# Patient Record
Sex: Male | Born: 1995 | Race: Black or African American | Hispanic: No | Marital: Single | Smoking: Smoker, current status unknown
Health system: Southern US, Community
[De-identification: ages and names within clinical notes are randomized; demographics above are authoritative.]

## PROBLEM LIST (undated history)

## (undated) DIAGNOSIS — J45909 Unspecified asthma, uncomplicated: Secondary | ICD-10-CM

## (undated) HISTORY — DX: Unspecified asthma, uncomplicated: J45.909

## (undated) HISTORY — PX: ARTHROSCOPY KNEE, MENISCUS REPAIR: SHX51041

## (undated) NOTE — Progress Notes (Signed)
 Formatting of this note is different from the original.    Travis Young is a 48 yr old male   MRN: [id number]    = = =  Pre-SS VISIT: OV 08/15/2020 [facility] CARPENTER MD  HTN no  = = =  Pt  picked up & instructed to do:      Home sleep study system.     2020-08-15, WP # [id number], (AM)    Home sleep study equipment loaner order Code ([id number]) entered.   Directions followed w/o difficulty.   Loaner agreement signed by patient for return of this unit on the next business day.  = =   Referral note:   Provider Comments  08/15/2020 1:39 PM EST  Entered by Ahmed M.D., Vanice F   Pertinent history:   *fatigue and daytime sleepiness For Medicare (or potential Medicare) patients, the following questions must be answered at a face-to-face visit. Date of face-to-face visit: 08/15/2020 ( date or NA).    Risk for Obstructive Sleep Apnea:  Does the patient report:  Snoring:no  Daytime fatigue, excessive daytime sleepiness, feel tired:yes  Stopping breathing while sleeping:no  Hypertension:no     = = =                    [facility] sleep medicine sleep apnea questionnaire    ESS 12/24    1) Have you had sleep study in past?  no If yes; where:  If yes when:    2) Do you have upcoming surgery? no  3) Have you been diagnosed with any of:  Sleep apnea no  High blood pressure (HTN) no  Congestive heart failure (CHF) no  Atrial Fibrillation (AF) no  Stroke? no   4) Are you planning to have bariatric surgery? no   5) Do you have a heart pacemaker?  no  6) Do you take opiates for pain? no  7) Have you been prescribed oxygen for home use?  no  8) Do you snore?  no  9) Has anyone told you that you stop breathing at night? no  10) Do you wake up gasping for air or choking?  no  11) Do you feel tired on waking up in the morning? yes  12) What time do you go to bed?  2330 hours  13) What time do you wake up? 0530 hours  14) How much time does it take for you to fall asleep? 30 min    Completed by:  LOUIE KINGS, August 15, 2020, 2:24 PM      Electronically  signed by Kings Louie at 08/15/2020  2:30 PM EST

## (undated) NOTE — Addendum Note (Signed)
 Addended by: ROANNE HAKIM, CAMILLE on: 08/17/2020 09:55 AM     Modules accepted: Orders      Electronically signed by Roanne Hakim Gammons at 08/17/2020  9:55 AM EST

## (undated) NOTE — Progress Notes (Signed)
 Formatting of this note might be different from the original.  I have offered the flu vaccine to the member, the member has declined. The main reason for declination today is:    Pt declined  Electronically signed by Stacia Outlaw at 08/15/2020  2:30 PM EST

## (undated) NOTE — Progress Notes (Signed)
 Formatting of this note might be different from the original.  Impression:  Mild obstructive sleep apnea*    Recommendations:     Clinical correlation is recommended to determine optimal treatment options including CPAP, oral appliance, surgery, positional therapy and or weight loss.    If CPAP is chosen, initiate home AutoPAP titration as per protocol. The patient should have telephone appointment with sleep apnea clinic in two few weeks after using APAP device to assess compliance and efficacy of the machine through [facility].    Caution should be exercised in operating heavy machinery or driving and the patient should not drive if feeling sleepy.    Counsel on importance of weight loss and encourage to maintain ideal body weight.    Avoid use of alcohol and CNS/respiratory depressant medications     Counsel on importance of obtaining adequate amount of sleep and encourage the patient to obtain 7 to 8 hours of sleep every night.    * Patients with mild obstructive sleep apnea needs treatment if they have other symptoms or comorbidities like excessive daytime sleepiness, insomnia, hypertension, ischemic heart disease, mood disorder, impaired cognition, Afib, and Hx of stroke    Completed by:  PEYTON KIN MD, August 21, 2020, 3:33 PM     Electronically signed by Mirabella, Allyson (M.D.), M.D. at 08/21/2020  3:33 PM EST

## (undated) NOTE — Telephone Encounter (Signed)
 Formatting of this note is different from the original.    [place]  Medicaid Case Management Outreach Note      11/18/2023    Reason for Outreach: Ongoing Case Management Follow-Up     Outreach Outcome:  Coverage terminated. No further follow up needed.  Patient was notified of his coverage termination and he confirmed that he is aware and plans to follow up with [place] Medicaid. CM provided him [place] Medicaid phone number at [phone].   Follow-Up Communication Plan: No further follow up needed.     Donato (R.N.) Suann, R.N.  [place]  Medicaid Case Manager  11/18/2023, 1:35 PM   Donato Suann, RN, MSN, CCM  [facility] [place]  Case Manager  Tel: [phone]    Electronically signed by Suann Donato (R.N.), R.N. at 11/18/2023  1:50 PM EDT

## (undated) NOTE — Progress Notes (Signed)
 Formatting of this note might be different from the original.  Images from the original note were not included.        Electronically signed by Roanne Sisto Gammons at 08/17/2020  9:55 AM EST

---
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
## 2016-02-08 ENCOUNTER — Ambulatory Visit: Payer: Medicaid Other

## 2016-02-08 NOTE — Pre-Procedure Instructions (Signed)
PSS interview with patient's mother, Alona Bene, at request of pt and his mother.

## 2016-02-12 ENCOUNTER — Ambulatory Visit
Admission: RE | Admit: 2016-02-12 | Discharge: 2016-02-12 | Disposition: A | Discharge status: Home / Self Care | Payer: Medicaid Other | Source: Ambulatory Visit | Attending: Orthopaedic Surgery | Admitting: Orthopaedic Surgery

## 2016-02-12 ENCOUNTER — Ambulatory Visit: Payer: Medicaid Other

## 2016-02-12 ENCOUNTER — Ambulatory Visit: Payer: Medicaid Other | Admitting: Anesthesiology

## 2016-02-12 ENCOUNTER — Encounter
Admission: RE | Disposition: A | Discharge status: Home / Self Care | Payer: Self-pay | Source: Ambulatory Visit | Attending: Orthopaedic Surgery

## 2016-02-12 ENCOUNTER — Ambulatory Visit: Payer: Medicaid Other | Admitting: Orthopaedic Surgery

## 2016-02-12 DIAGNOSIS — W2209XA Striking against other stationary object, initial encounter: Secondary | ICD-10-CM | POA: Insufficient documentation

## 2016-02-12 DIAGNOSIS — S62324D Displaced fracture of shaft of fourth metacarpal bone, right hand, subsequent encounter for fracture with routine healing: Secondary | ICD-10-CM

## 2016-02-12 DIAGNOSIS — S62326A Displaced fracture of shaft of fifth metacarpal bone, right hand, initial encounter for closed fracture: Secondary | ICD-10-CM | POA: Insufficient documentation

## 2016-02-12 DIAGNOSIS — S62329A Displaced fracture of shaft of unspecified metacarpal bone, initial encounter for closed fracture: Secondary | ICD-10-CM | POA: Diagnosis present

## 2016-02-12 DIAGNOSIS — S62324A Displaced fracture of shaft of fourth metacarpal bone, right hand, initial encounter for closed fracture: Secondary | ICD-10-CM | POA: Insufficient documentation

## 2016-02-12 HISTORY — PX: ORIF, HAND: SHX4890

## 2016-02-12 SURGERY — ORIF, HAND
Anesthesia: Anesthesia General | Site: Hand | Laterality: Right | Wound class: Clean

## 2016-02-12 MED ORDER — LACTATED RINGERS IV SOLN
INTRAVENOUS | Status: DC | PRN
Start: 2016-02-12 — End: 2016-02-12

## 2016-02-12 MED ORDER — ONDANSETRON HCL 4 MG/2ML IJ SOLN
4.0000 mg | Freq: Once | INTRAMUSCULAR | Status: DC | PRN
Start: 2016-02-12 — End: 2016-02-12

## 2016-02-12 MED ORDER — ONDANSETRON HCL 4 MG/2ML IJ SOLN
INTRAMUSCULAR | Status: DC | PRN
Start: 2016-02-12 — End: 2016-02-12
  Administered 2016-02-12: 4 mg via INTRAVENOUS

## 2016-02-12 MED ORDER — OXYCODONE HCL 5 MG PO TABS
5.0000 mg | ORAL_TABLET | Freq: Once | ORAL | Status: DC | PRN
Start: 2016-02-12 — End: 2016-02-12

## 2016-02-12 MED ORDER — ONDANSETRON HCL 4 MG/2ML IJ SOLN
INTRAMUSCULAR | Status: DC
Start: 2016-02-12 — End: 2016-02-12
  Filled 2016-02-12: qty 2

## 2016-02-12 MED ORDER — FENTANYL CITRATE (PF) 50 MCG/ML IJ SOLN (WRAP)
25.0000 ug | INTRAMUSCULAR | Status: DC | PRN
Start: 2016-02-12 — End: 2016-02-12

## 2016-02-12 MED ORDER — BUPIVACAINE HCL (PF) 0.5 % IJ SOLN
INTRAMUSCULAR | Status: AC
Start: 2016-02-12 — End: ?
  Filled 2016-02-12: qty 30

## 2016-02-12 MED ORDER — OXYCODONE-ACETAMINOPHEN 5-325 MG PO TABS
2.0000 | ORAL_TABLET | ORAL | Status: DC | PRN
Start: 2016-02-12 — End: 2017-10-02

## 2016-02-12 MED ORDER — FENTANYL CITRATE (PF) 50 MCG/ML IJ SOLN (WRAP)
INTRAMUSCULAR | Status: DC
Start: 2016-02-12 — End: 2016-02-12
  Filled 2016-02-12: qty 2

## 2016-02-12 MED ORDER — LIDOCAINE HCL (PF) 2 % IJ SOLN
INTRAMUSCULAR | Status: AC
Start: 2016-02-12 — End: ?
  Filled 2016-02-12: qty 5

## 2016-02-12 MED ORDER — CEFAZOLIN SODIUM-DEXTROSE 2-3 GM-% IV SOLR
INTRAVENOUS | Status: AC
Start: 2016-02-12 — End: ?
  Filled 2016-02-12: qty 50

## 2016-02-12 MED ORDER — PROMETHAZINE HCL 25 MG/ML IJ SOLN
6.2500 mg | Freq: Once | INTRAMUSCULAR | Status: DC | PRN
Start: 2016-02-12 — End: 2016-02-12

## 2016-02-12 MED ORDER — PROPOFOL 10 MG/ML IV EMUL (WRAP)
INTRAVENOUS | Status: DC | PRN
Start: 2016-02-12 — End: 2016-02-12
  Administered 2016-02-12: 240 mg via INTRAVENOUS

## 2016-02-12 MED ORDER — BUPIVACAINE HCL (PF) 0.5 % IJ SOLN
INTRAMUSCULAR | Status: DC | PRN
Start: 2016-02-12 — End: 2016-02-12
  Administered 2016-02-12: 20 mL

## 2016-02-12 MED ORDER — MIDAZOLAM HCL 2 MG/2ML IJ SOLN
INTRAMUSCULAR | Status: AC
Start: 2016-02-12 — End: ?
  Filled 2016-02-12: qty 2

## 2016-02-12 MED ORDER — DEXAMETHASONE SODIUM PHOSPHATE 4 MG/ML IJ SOLN (WRAP)
INTRAMUSCULAR | Status: DC | PRN
Start: 2016-02-12 — End: 2016-02-12
  Administered 2016-02-12: 8 mg via INTRAVENOUS

## 2016-02-12 MED ORDER — FENTANYL CITRATE (PF) 50 MCG/ML IJ SOLN (WRAP)
INTRAMUSCULAR | Status: DC | PRN
Start: 2016-02-12 — End: 2016-02-12
  Administered 2016-02-12: 50 ug via INTRAVENOUS
  Administered 2016-02-12 (×4): 25 ug via INTRAVENOUS
  Administered 2016-02-12: 50 ug via INTRAVENOUS

## 2016-02-12 MED ORDER — MIDAZOLAM HCL 2 MG/2ML IJ SOLN
INTRAMUSCULAR | Status: DC | PRN
Start: 2016-02-12 — End: 2016-02-12
  Administered 2016-02-12: 2 mg via INTRAVENOUS

## 2016-02-12 MED ORDER — CEFAZOLIN SODIUM-DEXTROSE 2-3 GM-% IV SOLR
2.0000 g | Freq: Once | INTRAVENOUS | Status: AC
Start: 2016-02-12 — End: 2016-02-12
  Administered 2016-02-12: 2 g via INTRAVENOUS

## 2016-02-12 MED ORDER — LABETALOL HCL 5 MG/ML IV SOLN
20.0000 mg | Freq: Once | INTRAVENOUS | Status: DC
Start: 2016-02-12 — End: 2016-02-12

## 2016-02-12 MED ORDER — GELATIN ABSORBABLE 100 EX MISC
CUTANEOUS | Status: AC
Start: 2016-02-12 — End: ?
  Filled 2016-02-12: qty 1

## 2016-02-12 MED ORDER — PROPOFOL 10 MG/ML IV EMUL (WRAP)
INTRAVENOUS | Status: AC
Start: 2016-02-12 — End: ?
  Filled 2016-02-12: qty 20

## 2016-02-12 MED ORDER — SCOPOLAMINE 1 MG/3DAYS TD PT72
MEDICATED_PATCH | TRANSDERMAL | Status: AC
Start: 2016-02-12 — End: ?
  Filled 2016-02-12: qty 1

## 2016-02-12 MED ORDER — FENTANYL CITRATE (PF) 50 MCG/ML IJ SOLN (WRAP)
INTRAMUSCULAR | Status: AC
Start: 2016-02-12 — End: ?
  Filled 2016-02-12: qty 2

## 2016-02-12 MED ORDER — HYDROMORPHONE HCL 1 MG/ML IJ SOLN
INTRAMUSCULAR | Status: DC | PRN
Start: 2016-02-12 — End: 2016-02-12
  Administered 2016-02-12 (×2): 500 ug via INTRAVENOUS

## 2016-02-12 MED ORDER — HYDROMORPHONE HCL 1 MG/ML IJ SOLN
INTRAMUSCULAR | Status: DC | PRN
Start: 2016-02-12 — End: 2016-02-12

## 2016-02-12 MED ORDER — HYDROMORPHONE HCL 1 MG/ML IJ SOLN
0.5000 mg | INTRAMUSCULAR | Status: DC | PRN
Start: 2016-02-12 — End: 2016-02-12

## 2016-02-12 MED ORDER — ONDANSETRON HCL 4 MG/2ML IJ SOLN
INTRAMUSCULAR | Status: AC
Start: 2016-02-12 — End: ?
  Filled 2016-02-12: qty 2

## 2016-02-12 MED ORDER — HYDRALAZINE HCL 20 MG/ML IJ SOLN
10.0000 mg | Freq: Once | INTRAMUSCULAR | Status: DC
Start: 2016-02-12 — End: 2016-02-12

## 2016-02-12 MED ORDER — DEXAMETHASONE SODIUM PHOSPHATE 4 MG/ML IJ SOLN
INTRAMUSCULAR | Status: AC
Start: 2016-02-12 — End: ?
  Filled 2016-02-12: qty 2

## 2016-02-12 MED ORDER — ONDANSETRON 4 MG PO TBDP
4.0000 mg | ORAL_TABLET | Freq: Three times a day (TID) | ORAL | Status: DC | PRN
Start: 2016-02-12 — End: 2020-08-16

## 2016-02-12 MED ORDER — HYDROMORPHONE HCL 1 MG/ML IJ SOLN
INTRAMUSCULAR | Status: AC
Start: 2016-02-12 — End: ?
  Filled 2016-02-12: qty 1

## 2016-02-12 MED ORDER — IBUPROFEN 200 MG PO TABS
800.0000 mg | ORAL_TABLET | Freq: Three times a day (TID) | ORAL | Status: DC | PRN
Start: 2016-02-12 — End: 2020-08-16

## 2016-02-12 MED ORDER — ACETAMINOPHEN 325 MG PO TABS
650.0000 mg | ORAL_TABLET | Freq: Once | ORAL | Status: DC | PRN
Start: 2016-02-12 — End: 2016-02-12

## 2016-02-12 MED ORDER — LIDOCAINE HCL 2 % IJ SOLN
INTRAMUSCULAR | Status: DC | PRN
Start: 2016-02-12 — End: 2016-02-12
  Administered 2016-02-12: 20 mg

## 2016-02-12 SURGICAL SUPPLY — 48 items
APPLCATOR CHLORAPREP 26ML (Prep) ×4 IMPLANT
BANDAGE ELASTIC 4 IN X 5 YDS (Bandage) ×2 IMPLANT
BLADE SAGITTAL 9.5X25.5X 4MM (Blade) IMPLANT
BNDG WEBRIL NONSTRL 4IN (Procedure Accessories) ×1
CORD BIPOLAR STRL DISP (Cautery) ×2 IMPLANT
DRAPE 84X54IN MINI C ARM OEC 6800 DISPOSABLE (Drape) ×1 IMPLANT
DRAPE CARM MN 84X54IN DISP OEC 6800 (Drape) ×1
DRAPE MINI C-ARM  54X84 (Drape) ×1
DRESSING PETRO 3% BI 3BRM GZE XR 8X1IN (Dressing) ×1
DRESSING PETROLATUM XEROFORM L8 IN X W1 (Dressing) ×1
DRESSING PETROLATUM XEROFORM L8 IN X W1 IN 3% BISMUTH TRIBROMOPHENATE (Dressing) ×1 IMPLANT
DRESSING XEROFORM 1X8 (Dressing) ×1
DRILL TWIST 1.6X25MM (Drillbits) ×1 IMPLANT
GLOVE 7.5 PI ULTRA TOUCH (Glove) ×1
GLOVE SRG PLISPRN 7.5 BGL PI ULTRATOUCH (Glove) ×1
GLOVE SURG BIOGEL INDIC SZ 7.5 (Glove) ×2 IMPLANT
GLOVE SURGICAL 7 1/2 BIOGEL PI (Glove) ×1
GLOVE SURGICAL 7 1/2 BIOGEL PI ULTRATOUCH G POWDER FREE ROUGH BEAD (Glove) ×1 IMPLANT
PADDING CAST L4 YD X W4 IN COHESION HAND TEARABLE SELF BOND SPECIALIST (Procedure Accessories) ×1 IMPLANT
PADDING CST CTTN SPCLST 100 4YDX4IN LF (Procedure Accessories) ×1
PLATE 2.0/2.3 6H STRT (Plate) ×1 IMPLANT
PLATE APTUS LCKNG 2.0 5X2HOLE (Plate) ×1 IMPLANT
PLATE BN TI Y APTUS 35MMX1MM 2X5 HL HND (Plate) ×1 IMPLANT
PLATE L35 MM X H1 MM Y HAND 2 X 5 HOLE TITANIUM APTUS BONE 2/2.3 MM (Plate) IMPLANT
SCR CRTCL HD6 2X10MM (Screw) ×3 IMPLANT
SCR LKNG HD6 2MM 8MM (Screw) ×1 IMPLANT
SCR LKNG HD6 2X12MM (Screw) ×1 IMPLANT
SCREW CORT BONE HD6 2.0MMX09MM (Screw) ×3 IMPLANT
SCREW CORTX BONE 1.5MMX 09MM (Screw) ×1 IMPLANT
SCREW LCKG 2.0X10MM (Screw) ×2 IMPLANT
SCREW TRILOCK LCKNG 2.0X14 (Screw) ×3 IMPLANT
SOL NACL .9% IRRIG 500ML (Irrigation Solutions) ×1
SOLUTION IRR 0.9% NACL 500ML LF STRL PLS (Irrigation Solutions) ×1
SOLUTION IRRIGATION 0.9% SDM CHLORIDE 500ML PR BTTL ISOTONIC NONPRGNC (Irrigation Solutions) ×1 IMPLANT
SOLUTION IRRIGATION 0.9% SODIUM CHLORIDE (Irrigation Solutions) ×1
SPLINT ORTH PLSTR OF PARIS SPCLST 15X4IN (Cast) ×1
SPLINT ORTHOPEDIC 15X4IN FAST SET PLASTER OF PARIS SPECIALIST LF (Cast) ×1 IMPLANT
SPLINT PLASTER 4X15 (Cast) ×1
SPONGE GAUZE 16PLY STRL 4X4 (Sponge) ×2
SPONGE GAUZE L4 IN X W4 IN 16 PLY (Sponge) ×2
SPONGE GAUZE L4 IN X W4 IN 16 PLY MAXIMUM ABSORBENT TRAY CURITY (Sponge) ×2 IMPLANT
SPONGE GZE PLS CTTN CRTY 4X4IN LF STRL (Sponge) ×2
SUT ETHILON 4-0 PS2 18IN (Suture) ×2 IMPLANT
SUT VICRYL 4-0 P2 18IN (Suture) ×2 IMPLANT
SUTURE MONOCRYL 4-0 PS2 27IN (Suture) ×2 IMPLANT
TOURNIQUET 18IN STRL (Procedure Accessories) ×2 IMPLANT
TRAY HAND IAH (Pack) ×2 IMPLANT
TWIST DRILL (Drillbits) ×1 IMPLANT

## 2016-02-12 NOTE — H&P (Signed)
ADMISSION HISTORY AND PHYSICAL EXAM    Date Time: 02/12/2016 12:33 PM  Patient Name: Travis Young  Attending Physician: Arta Bruce, MD    Assessment:   20 yo male with right 4th and 5th metacarpal shaft fractures    Plan:   ORIF right 4th and 5th metacarpal shaft fractures.    History of Present Illness:   Travis Young is a 20 y.o. male who presents to the hospital with above noted injury sustained on 02/02/16 when he punched a wall.  He had immediate pain and deformity.  He presented to myself on 02/07/16 with displaced and angulated right 4th and 5th metacarpal fractures.  He is otherwise healthy and denies other joint pain.  He is indicated for ORIF of his right 4th and 5th metacarpal fractures.    Past Medical History:     Past Medical History   Diagnosis Date   . Asthma without status asthmaticus      childhood, "has outgrown"       Past Surgical History:     Past Surgical History   Procedure Laterality Date   . Arthroscopy knee, meniscus repair Right        Family History:     Family History   Problem Relation Age of Onset   . Hypertension Mother    . Hypertension Father    . Asthma Maternal Grandfather        Social History:     Social History     Social History   . Marital Status: Single     Spouse Name: N/A   . Number of Children: N/A   . Years of Education: N/A     Social History Main Topics   . Smoking status: Passive Smoke Exposure - Never Smoker   . Smokeless tobacco: Never Used   . Alcohol Use: No   . Drug Use: No   . Sexual Activity: Not on file     Other Topics Concern   . International Travel In Past 12 Mos No     Social History Narrative       Allergies:   No Known Allergies    Medications:     Prescriptions prior to admission   Medication Sig   . oxyCODONE-acetaminophen (PERCOCET) 5-325 MG per tablet Take 1 tablet by mouth as needed for Pain.   Marland Kitchen ibuprofen (ADVIL,MOTRIN) 600 MG tablet Take 600 mg by mouth every 6 (six) hours as needed for Pain.       Review of Systems:   A comprehensive  review of systems was: Negative except right hand pain.    Physical Exam:     Filed Vitals:    02/12/16 1223   BP: 152/88   Pulse: 65   Temp: 97.4 F (36.3 C)   Resp: 17   SpO2: 100%       Intake and Output Summary (Last 24 hours) at Date Time  No intake or output data in the 24 hours ending 02/12/16 1233    General appearance - alert, well appearing, and in no distress  Neck - supple, no significant adenopathy  Chest - no tachypnea, retractions or cyanosis  Heart - normal rate and regular rhythm  Abdomen - no rebound tenderness noted  Neurological - alert, oriented, normal speech, no focal findings or movement disorder noted, motor and sensory grossly normal bilaterally  Musculoskeletal - no joint tenderness, deformity or swelling, abnormal exam of right hand, TTP over ulnar aspect with limited ROM of  right small and ring fingers due to pain.  No open injury.  No evidence of compartment syndrome.  Extremities - peripheral pulses normal, no pedal edema, no clubbing or cyanosis    Labs:     Results     ** No results found for the last 24 hours. **              Rads:   Radiological Procedure reviewed.    Signed by: Arta Bruce

## 2016-02-12 NOTE — Op Note (Signed)
Procedure Date: 02/12/2016     Patient Type: A     SURGEON: Norva Karvonen MD  ASSISTANT:       ASSISTANT:  Tressie Ellis, SA.     PREOPERATIVE DIAGNOSES:  1.  Right fourth metacarpal shaft fracture.  2.  Right fifth metacarpal shaft fracture.     POSTOPERATIVE DIAGNOSIS:  1.  Right fourth metacarpal shaft fracture.  2.  Right fifth metacarpal shaft fracture.     TITLE OF PROCEDURES:   1.  Open reduction internal fixation of right fourth metacarpal shaft  fracture.  2.  Open reduction internal fixation right fifth metacarpal shaft fracture.     ANESTHESIA:  General endotracheal with LMA and local anesthetic field block with 20 mL  of 0.5% plain bupivacaine in the area of the incisions, as well as in a  dorsal wrist block, encompassing the ulnar nerve.     FLUIDS:  1200 mL lactated Ringer.     ESTIMATED BLOOD LOSS:  Less than 10 mL.     TOTAL TOURNIQUET TIME:  104 minutes at 250 mmHg pressure.     DRAINS:  None.     SPECIMENS:  None.     COMPLICATIONS:  None.     CONDITION:  The patient was stable to PACU.     INDICATIONS:  Mr. Belsky is an 20 year old male who injured his right hand when he  punched a wall roughly 1 week prior to surgical intervention.  He had  increased pain, deformity and limited range of motion.  He presented to my  clinic, where radiographs demonstrated displaced angulated fourth and fifth  metacarpal shaft fractures with slight evidence of malrotation, but  certainly evidence of shortening and palmar angulation, apex dorsal.  Due  to the patient's age, functional activity level, the presence of the  deformity and 2 metacarpals involved, the patient was counseled as to the  benefits of operative fixation versus open reduction internal fixation to  prevent posttraumatic deformity, allow for early range of motion and  prevent stiffness and scarring.  I discussed in detail with the patient the  risks, benefits, alternatives and the procedure, the main risks to include  bleeding, infection, nerve,  artery or tendon injury, failure of bone  healing, bone healing in poor position, need for further surgery,  stiffness, scarring, persistent pain, limited range of motion.  After  weighing the risks, benefits and options, the patient wished to proceed  with operative intervention.     DESCRIPTION OF PROCEDURE:  The patient identified in the preoperative area and the right hand was  marked.  Consent was obtained.  The patient received 2 g IV Ancef within 30  minutes of surgical intervention for antibiotic prophylaxis.  The patient  was brought to the operating room, placed supine on the operating table  with the hand table attached and all bony prominences and extremities well  padded.  A well-padded tourniquet was applied to the right brachium and  general endotracheal anesthesia was induced and LMA placed.  Table was  rotated 90 degrees to provide access to the right upper extremity.  A  timeout occurred, identifying the patient, the procedure he was to undergo  and the operative extremity.  Mini-C-arm was brought in the field and used  to confirm the fracture sites, and preliminary radiographs were performed.   Right upper extremity was then prepped from the tips of the fingers to the  level of the tourniquet with ChloraPrep and draped in the usual  sterile  fashion.  A secondary timeout occurred, identifying the patient, the  procedure he was to undergo and the operative extremity.  Right upper  extremity was exsanguinated, tourniquet inflated to 250 mmHg pressure.  A  6-cm longitudinal incision in the dorsal aspect of the fourth metacarpal  shaft was made down through skin to subcutaneous tissue.  Full-thickness  skin flaps were elevated radially and ulnarly as meticulous scissor  dissection was used to dissect down to the fourth metacarpal shaft.  The  extensor tendon was identified overlying the metacarpal shaft and gently  retracted radialward which gained [place] access between the extensor tendon  ring finger and  extensor tendon to the small.  The periosteum was then  incised on its most superficial aspect and released circumferentially at  the site of the fracture and the fracture site debrided with knife and  meticulous Freer elevator dissection to mobilize the fracture fragments.   It was found that the fracture was actually a 4-part fracture with a more  distal shaft component and a more proximal shaft component at the  metaphyseal diaphyseal junction with a sagittal split into the base  fragments that was unrecognized on the plain radiographs themselves.   Despite this, through gentle traction and manipulation with the [facility], the fracture was able to be reduced near anatomically and  provisionally pinned in place with a retrograde driven 0.045-inch K-wire.   This K-wire was cut short as Hohmann retractors were inserted to aid in  visualization mini-C-arm was brought into the field and used to confirm the  fracture was nearly anatomically reduced, and a [employer]artis T-shaped 2.0-mm  locking plate from the Aptus modular hand set was selected provisionally  and bent into position to allow for anatomic reduction and fitting the  dorsal aspect of the fracture well.  This was arranged so that the  horizontal T component of the plate was at the base at the metaphyseal  component of the fracture and that the shaft of the plate extended out  along with the shaft of the bone.  This provisionally was held into place  with a pointed bone reduction forcep, and then the proximal 2 screw holes  were overdrilled and 2-mm by 14 mm locking screws placed in the shaft of  the bone.  This was followed by placement of a 2.0 mm x 10-mm shaft screw  distally at the far distal end of the plate, and then fracture fixation of  this component was completed by placement of 2 additional 2.0 mm x 10-mm  locking screws at the distal aspect of the plate itself.  There was found  to be an additional split fragment on the more radial aspect of  the  metaphyseal component that was felt to be provisionally locked into place  with the use of an interfragmentary screw.  Therefore, this fragment was  reduced with a pointed bone reduction forcep, and then a 1.5 mm x 10-mm  cortical screw was used to interfragmentary lag this fragment in place,  getting improved anatomic reduction and excellent fixation.  The  provisional K-wire was then removed and mini-C-arm verified screw lengths  and reduction, as well as plate position, as clinically finger motion was  performed passively demonstrated no scissoring or malrotation.  With the  fourth metacarpal fixed, attention was turned to the fifth.  An additional  longitudinal incision with a 2-mm skin bridge between this incision and the  fourth metacarpal shaft incision was made slightly more  distal and over the  MCP joint/neck of the metacarpal down through skin to subcutaneous tissue.   Sharp dissection was carried down past the extensor digitorum communis to  the small finger and the EDQ tendon and onto the dorsal periosteum.   Distally, the capsular tissues were released slightly with sharp dissection  off of bone to allow for mobilization of the extensor tendon in a radial  direction.  Blunt-tipped Weitlaner retractor was inserted to aid in  visualization, and then circumferential control of the fracture fragments  was obtained by sharp knife dissection and Therapist, nutritional use.  With this  completed through a combination of direct traction and extension of the  distal fragment, the fracture was reduced, and provisionally once more held  into place with a retrograde placed 0.045-inch K-wire.  With the fracture  now held in near anatomic reduction as verified on mini-C-arm, a 6-hole  Med[employer]is 2.0-mm Aptus modular hand locking plate was selected and  provisionally held into place in the dorsal aspect of the fracture with the  use of a pointed bone reduction forcep.  The mini-C-arm was then used to  confirm that the  plate was in adequate position, and then all 6 screws were  overdrilled with a 1.8 mm drill bit and two 10 mm x 2.0-mm cortical screws  placed, 1 distal and proximal to the fracture fragment to suck the plate to  bone, followed by an additional three 2.0 mm x 10 mm and appropriate length  2.0-mm locking screws placed both distal and proximal to the fracture  fragment.  The third from most proximal screw hole, we were unable to gain  purchase appropriately with the locking screw.  Therefore, the drill hole  was redirected and a 2.0 mm x 10-mm cortical screw placed, gaining  excellent purchase and fixation at this site.  The fingers were then taken  through gentle range of motion, verifying no scissoring or malrotation, as  retractors and K-wires were removed.  The mini-C-arm verified near anatomic  reduction of both the fourth and fifth metacarpal shaft fractures.  The  wounds were copiously irrigated with sterile normal saline and the  periosteum repaired back with the plates with a running locking 4-0 Vicryl  suture.  The skin was then repaired with buried interrupted 4-0 Vicryl  suture, followed by a running 4-0 subcuticular Prolene closure.  All counts  were correct.  The patient's wound edges and the subcutaneous tissues were  infiltrated with a total of 10 mL of 0.5% plain bupivacaine.  The dorsal  ulnar sensory nerve and ulnar nerve themselves were infiltrated in their  general area with a total of an additional 10 mL of 0.5% plain bupivacaine.   Wounds were dressed sterilely with Mastisol and Steri-Strips and  tourniquet released.  Less than 2-second capillary refill was seen distally  at the fingertips following completion and wound dressed sterilely with  Xeroform, 4 x 4 kgauze, 2-inch Webril.  The patient was placed in an ulnar  gutter splint intrinsic plus position, awakened and taken to the recovery  room in satisfactory and stable condition.     DISPOSITION:  The patient will be discharged to home with  Percocet for pain control,  Zofran for treatment of nausea, ibuprofen as a means to prevent  inflammation and improve pain.  Strict elevation of right upper extremity.   May use his hand for light activity only and no lifting greater than 5  pounds.  Dressing should remain on clean,  dry and intact.  Followup  appointment in 1 week's time.           D:  02/12/2016 19:20 PM by Dr. Rose Phi. Maisie Fus, MD ([id number])  T:  02/12/2016 20:00 PM by       Everlean Cherry: [id number]) (Doc ID: [id number])

## 2016-02-12 NOTE — Brief Op Note (Signed)
BRIEF OP NOTE    Date Time: 02/12/2016 3:17 PM    Patient Name:   Travis Young    Date of Operation:   02/12/2016    Providers Performing:   Surgeon(s):  Arta Bruce, MD    Assistant (s):   Circulator: Bo Merino, RN  Relief Circulator: Renae Fickle, RN; Drema Dallas, RN; Benjaman Pott, RN  Scrub Person: Beverely Pace, RN    Operative Procedure:   Procedure(s):  Open reduction internal fixation right fourth and fifth metacarpal fractures    Preoperative Diagnosis:   Pre-Op Diagnosis Codes:     * Closed displaced fracture of base of fourth metacarpal bone of right hand, initial encounter [S62.314A]     * Closed nondisplaced fracture of shaft of fifth metacarpal bone of right hand, initial encounter [S62.356A]    Postoperative Diagnosis:   Post-Op Diagnosis Codes:     * Closed displaced fracture of base of fourth metacarpal bone of right hand, initial encounter [S62.314A]     * Closed nondisplaced fracture of shaft of fifth metacarpal bone of right hand, initial encounter [S62.356A]    Anesthesia:   General    Estimated Blood Loss:    * No values recorded between 02/12/2016  1:01 PM and 02/12/2016  3:16 PM *    Implants:     Implant Name Type Inv. Item Serial No. Manufacturer Lot No. LRB No. Used Action   PLATE APTUS LCKNG 2.0 5X2HOLE - [id number] Plate PLATE APTUS LCKNG 2.0 5X2HOLE  [employer]  Right 1 Implanted   SCREW LCKG 2.0X10MM - [id number] Screw SCREW LCKG 2.0X10MM  [employer]  Right 2 Implanted   SCREW TRILOCK LCKNG 2.0X14 - [id number] Screw SCREW TRILOCK LCKNG 2.0X14  [employer]  Right 1 Implanted   SCREW CORT BONE HD6 2.0MMX09MM - [id number] Screw SCREW CORT BONE HD6 2.0MMX09MM  [employer]  Right 2 Implanted   SCREW CORTX BONE 1.  - [id number] Screw SCREW CORTX BONE 1.   [employer]  Right 1 Implanted   SCR CRTCL HD6 2X10MM - [id number] Screw SCR CRTCL HD6 2X10MM  [employer]  Right 2 Implanted   SCR CRTCL HD6 2X10MM - [id number] Screw SCR CRTCL HD6  2X10MM  [employer]  Right 1 Implanted   SCR LKNG HD6 2X12MM - [id number] Screw SCR LKNG HD6 2X12MM  [employer]  Right 1 Implanted   SCREW TRILOCK LCKNG 2.0X14 - [id number] Screw SCREW TRILOCK LCKNG 2.0X14  [employer]  Right 2 Implanted   PLATE 5.7/3.2 6H STRT - [id number] Plate PLATE 2.3/7.6 6H STRT   [employer]   Right 1 Implanted       Drains:   Drains: no    Specimens:       Findings:   Right 4th and 5th metacarpal fractures.    Complications:   None.      Signed by: Arta Bruce, MD                                                                           [facility]

## 2016-02-12 NOTE — Anesthesia Preprocedure Evaluation (Signed)
Anesthesia Evaluation    AIRWAY    Mallampati: I    TM distance: >3 FB  Neck ROM: full  Mouth Opening:full   CARDIOVASCULAR    cardiovascular exam normal       DENTAL    no notable dental hx     PULMONARY    pulmonary exam normal     OTHER FINDINGS                      Anesthesia Plan    ASA 1     general                             Post op pain management: per surgeon    informed consent obtained

## 2016-02-12 NOTE — PACU (Signed)
Patient states he has the urge to urinate but unable to void using urinal or when up in the bathroom. Bladder scan done and reads >720. Dr Maisie Fus called and updated. Order to straight cath x1 then patient may be discharged home.

## 2016-02-12 NOTE — Discharge Instructions (Addendum)
Elevate operative hand on 3 to 4 pillows while seated or lying down for next 72 hours.  May gently flex and extend fingers as dressing allows.  Do not squeeze a ball or perform any strengthening exercises.  May place ice on the palm side of the hand for 30 minutes on, 30 minutes off for the first 48 hours.  May not drive while taking narcotic pain medication.  Call to schedule follow-up appointment with Dr. Thomas for one weeks time.  May move shoulder and elbow gently.  No lifting > 5lbs. With the operative hand.  Notify MD if fever> 101.5 degrees F, numbness that lasts longer than 48 hours, excessive bloody or foul-smelling drainage.  If having chest pain or shortness of breath call 911.      Post Anesthesia Discharge Instructions    Although you may be awake and alert in the recovery room, small amounts of anesthetic remain in your system for about 24 hours.  You may feel tired and sleepy during this time.      You are advised to go directly home from the hospital.    Plan to stay at home and rest for the remainder of the day.    It is advisable to have someone with you at home for 24 hours after surgery.    Do not operate a motor vehicle, or any mechanical or electrical equipment for the next 24 hours.      Be careful when you are walking around, you may become dizzy.  The effects of anesthesia and/or medications are still present and drowsiness may occur    Do not consume alcohol, tranquilizers, sleeping medications, or any other non prescribed medication for the remainder of the day.    Diet:  begin with liquids, progress your diet as tolerated or as directed by your surgeon.  Nausea and vomiting may occur in the next 24 hours.

## 2016-02-12 NOTE — Anesthesia Postprocedure Evaluation (Signed)
Anesthesia Post Evaluation    Patient: Travis Young    Procedures performed: Procedure(s):  Open reduction internal fixation right fourth and fifth metacarpal fractures    Anesthesia type: General LMA    Patient location:PACU    Last vitals:   Filed Vitals:    02/12/16 1750   BP: 160/95   Pulse: 61   Temp:    Resp: 16   SpO2: 100%       Post pain: Patient not complaining of pain, continue current therapy      Mental Status:awake and alert     Respiratory Function: tolerating room air    Cardiovascular: Patient baseline pressure SBP 140s and elevated as high as 160-170s. Patient not complaining of any pain. Asymtomatic. Instructed to visit PCP as soon as possible and instructed to visit ED if he feels symptoms of vision changes, persistent headache, nausea/vomiting, shortness of breath, syncope, near syncope, or another other acute symptoms different than usual state of health.    Nausea/Vomiting: patient not complaining of nausea or vomiting    Hydration Status: adequate    Post assessment: no apparent anesthetic complications    [name], 02/12/2016 6:14 PM

## 2016-02-12 NOTE — Transfer of Care (Signed)
Anesthesia Transfer of Care Note    Patient: Travis Young    Procedures performed: Procedure(s):  Open reduction internal fixation right fourth and fifth metacarpal fractures    Anesthesia type: General LMA    Patient location:Phase I PACU    Last vitals:   Filed Vitals:    02/12/16 1521   BP: 157/92   Pulse: 87   Temp: 36.9 C (98.4 F)   Resp: 16   SpO2: 100%       Post pain: Patient not complaining of pain, continue current therapy      Mental Status:sedated    Respiratory Function: tolerating nasal cannula    Cardiovascular: stable    Nausea/Vomiting: patient not complaining of nausea or vomiting    Hydration Status: adequate    Post assessment: no apparent anesthetic complications, no reportable events and no evidence of recall    Signed by: Derrell Lolling  02/12/2016 3:34 PM

## 2016-02-15 ENCOUNTER — Encounter: Payer: Self-pay | Admitting: Orthopaedic Surgery

## 2016-02-26 ENCOUNTER — Ambulatory Visit
Admission: RE | Admit: 2016-02-26 | Discharge: 2016-02-26 | Disposition: A | Discharge status: Home / Self Care | Payer: Medicaid Other | Source: Ambulatory Visit | Attending: Orthopaedic Surgery | Admitting: Orthopaedic Surgery

## 2016-02-26 DIAGNOSIS — M25641 Stiffness of right hand, not elsewhere classified: Secondary | ICD-10-CM | POA: Insufficient documentation

## 2016-02-26 NOTE — OT Eval Note (Signed)
[facility]   Occupational Therapy Hand and Wrist Evaluation     Patient: Travis Young          MRN#: [id number]   DOB: [birth year]       AGE: 20 y.o.    Date: 02/26/2016    Time in:  1400  Time out: 1455    Medical Dx:  S/P ORIF right 4th & 5th metacarpal fractures (O53.664Q,     Treatment Dx: Stiffness of all fingers of right hand (I34.742)    DOI:  02-02-16    DOS:  02-12-16    Referring MD:  Maisie Fus    Precautions:  None    RX Orders: Ulnar guller hand / wrist splint in intrinsic minus position, A/P ROM, modalities, home program    SUBJECTIVE:    HPI: Pt reports that he punched a wall on 02-02-16 and underwent surgery on 02-12-16.     PMHx:  None reported.    Meds:  None    Pt chief complaints:   Limited finger motion.    Pt goals: To regain full use of the right hand.    ADLs:  Pt reports difficulty with all ADLs due to limited use of the right hand.    Pain level(0-10):  Moderate    OBJECTIVE:    Occupation:  Consulting civil engineer at [employer].    Social history:  Single, lives with Mother.    Leisure activities:  Gym    Hand Dominance:  Right  Involved Side:  Right    ROM:           AROM        INDEX        MIDDLE        RING        LITTLE   MP Flexion -25-35 -25-35 -25-30 -25-30   PIP Flexion 0-80 0-90 0-55 0-55   DIP Flexion 0-55 0-35 0-35 0-45     Strength:  N/A    Sensation:  Intact    Edema:  Mild / moderate    Coordination/Dexterity:  Impaired due to limited motion.    Inspection:  Pt came to therapy with dressing and half cast with ace wrap on hand / wrist.    Initial Tx:  Fabrication of an ulnar gutter splint for the hand / wrist, tendon gliding exercises, PIP / DIP blocking exercises and review of home program. Hand outs were issued.    ASSESSMENT:  Pt is an 20 year old male who present with limited finger motion, 2 weeks post ORIF of the fourth & fifth metacarpal fractures. Pt can benefit from OT to regain motion and full use of the right dominant hand.    Short Term Goals:  (in 3 weeks)  1. Pt will  be independent in home program and symptom management.  2. Pt will be able to flex all fingers to his palm to grasp objects.  3. Pt will have full finger extension.  4. Pt will rate his pain level 2 or less with hand use.    Long Term Goals:   (in 6 weeks)  1. Pt will be able to write with minimal to no difficulty.  2. Pt will be able to type on the computer with minimal to no difficulty.  3. Pt will be able to use a knife to cut food with minimal to no difficulty.  4. Pt will be able to open bottles and jars with minimal to no difficulty.  Discussed goals with patient:  Yes    Potential for recovery:  Good    PLAN:     Treatment will include modalities, exercises, therapeutic activities, education and home program.    Frequency: 1-2  x/wk x  6  wks    Salvadore Farber, CHT  [place] License #[id number]  [phone]    Charges    Units   Eval Low Complex       1   Ther ex       1     Medicaid Authorization    Pt Name: Travis Young  MR # [id number]  DOB: 1996-01-07  Certification Period:  02-26-16  through 04-16-16    MD Signature:      _____________________________________________ Date:____________________

## 2016-03-05 ENCOUNTER — Ambulatory Visit
Admission: RE | Admit: 2016-03-05 | Discharge: 2016-03-05 | Disposition: A | Discharge status: Home / Self Care | Payer: Medicaid Other | Source: Ambulatory Visit

## 2016-03-05 NOTE — Progress Notes (Signed)
[facility]  Occupational Therapy Cancellation Note      Patient:  Travis Young MRN#:  [id number]      Pt cancelled his Occupational Therapy appointment on 03/05/2016 secondary to school.     Leslee Home, Inetta Fermo, CHT  [place] License #[id number]  [phone]

## 2016-03-06 ENCOUNTER — Ambulatory Visit
Admission: RE | Admit: 2016-03-06 | Discharge: 2016-03-06 | Disposition: A | Discharge status: Home / Self Care | Payer: Medicaid Other | Source: Ambulatory Visit

## 2016-03-06 NOTE — Progress Notes (Signed)
[facility]  Occupational Therapy Treatment Note      Patient:  Travis Young MRN#:  [id number]  Date: 03/06/2016    Patient did not show for his Occupational Therapy appointment on 03/06/2016. A call was placed to pt's Mother. Pt or Mother will call back tomorrow to let us know if he will be able to attend his future appts.    Leslee Home, Inetta Fermo, CHT  [place] License #[id number]  [phone]

## 2016-03-11 ENCOUNTER — Ambulatory Visit
Admission: RE | Admit: 2016-03-11 | Discharge: 2016-03-11 | Disposition: A | Discharge status: Home / Self Care | Payer: Medicaid Other | Source: Ambulatory Visit

## 2016-03-11 NOTE — Progress Notes (Signed)
[facility]  Occupational Therapy Treatment Note      Patient:  Travis Young MRN#:  [id number]  Date: 03/11/2016    Patient did not show for his Occupational Therapy appointment on 03/11/2016. A call was placed to pt's Mother. All future appts were cancelled, as pt is still in school at those times. Pt's Mother will call back tomorrow to reschedule appts, after she talks to her son.    Leslee Home, Inetta Fermo, CHT  [place] License #[id number]  [phone]

## 2016-03-13 ENCOUNTER — Ambulatory Visit: Payer: Medicaid Other

## 2016-03-18 ENCOUNTER — Ambulatory Visit: Payer: Medicaid Other

## 2016-03-21 ENCOUNTER — Ambulatory Visit: Payer: Medicaid Other

## 2016-03-25 ENCOUNTER — Ambulatory Visit: Payer: Medicaid Other

## 2016-03-27 ENCOUNTER — Ambulatory Visit: Payer: Medicaid Other

## 2016-04-10 NOTE — Discharge Summary (Signed)
[facility]   Outpatient Occupational Therapy  [address]  [place], [place] [address]  [phone]      OCCUPATIONAL THERAPY DISCHARGE SUMMARY  Date:  04/10/2016    PATIENT:  Travis Young     DOB:  1995/10/21  DIAGNOSIS:   S/P ORIF right 4th & 5th metacarpal fractures (Y30.160F)  TREATMENT DIAGNOSIS: Stiffness of all fingers of right hand (M25.641)    DATES OF TREATMENT: 02-26-16 only            #VISITS: 1   # Cancel/No show: 3      Summary of Progress: Pt was seen for evaluation only. Pt did not attend several follow up visits and has not contacted OT to return. Current status is not known.    Plan:  D/C OT services, as pt has not returned.        Leslee Home, Inetta Fermo, CHT  [place] License #[id number]  [phone]

## 2020-08-16 ENCOUNTER — Emergency Department
Admission: EM | Admit: 2020-08-16 | Discharge: 2020-08-16 | Disposition: A | Discharge status: Home / Self Care | Payer: Medicaid HMO | Attending: Emergency Medicine | Admitting: Emergency Medicine

## 2020-08-16 DIAGNOSIS — R5383 Other fatigue: Secondary | ICD-10-CM | POA: Insufficient documentation

## 2020-08-16 LAB — GLUCOSE WHOLE BLOOD - POCT: Whole Blood Glucose POCT: 72 mg/dL (ref 70–100)

## 2020-08-16 NOTE — Discharge Instructions (Signed)
Dear Travis Young:    Thank you for choosing the [facility] Emergency Department, the premier emergency department in the  [place] area.  I hope your visit today was EXCELLENT. You will receive a survey via text message that will give you the opportunity to provide feedback to your team about your visit. Please do not hesitate to reach out with any questions!    Specific instructions for your visit today:    Weakness / Fatigue    You have been seen today for generalized weakness. This may also be described as fatigue.    Weakness is a common problem, especially in older individuals.    It is important to understand the difference between true weakness (real weakness from a nerve or brain problem) and the more common problem of fatigue. These words might seem similar but they do mean very different problems.   Fatigue: When a person is describing fatigue, they may feel tired out very quickly even with just a little activity. They may also say they are feeling tired, sleepy, easily exhausted and unable to do normal daily activities because they dont seem to have enough energy.   True Weakness: When someone has true weakness, it means that the muscles are not working right. For example, a leg might be truly weak if you can't support your weight on it or if you cant get up from a chair because the thigh muscles aren't strong enough.    There are many causes of weakness including; Infections (often kidney/bladder infections or pneumonias), electrolyte abnormalities (low sodium, low potassium), depression, and neurologic (brain or nerve) disorders.    After looking at the results of the blood tests or X-rays, the cause of your weakness is:   Unclear or unknown.    It is VERY IMPORTANT to see your primary care doctor. More testing may be needed to figure out the cause of your weakness.    YOU SHOULD SEEK MEDICAL ATTENTION IMMEDIATELY, EITHER HERE OR AT THE NEAREST EMERGENCY DEPARTMENT, IF ANY OF THE  FOLLOWING OCCURS:   Confusion, coma, agitation (becoming anxious or irritable).   Fever (temperature higher than 100.73F / 38C), vomiting.   Severe headache.   Signs of stroke (paralysis or numbness on one side of the body, drooping on one side of the face, difficulty talking).   Worsening weakness, difficulty standing, paralysis, loss of control of the bladder or bowels or difficulty swallowing.                 IF YOU DO NOT CONTINUE TO IMPROVE OR YOUR CONDITION WORSENS, PLEASE CONTACT YOUR DOCTOR OR RETURN IMMEDIATELY TO THE EMERGENCY DEPARTMENT.    Sincerely,    Sherrie Sport, PA-C  [facility] Emergency Department    ONSITE PHARMACY  Our full service onsite pharmacy is located in the ER waiting room.  Open 7 days a week from 9 am to 9 pm.  We accept all major insurances and prices are competitive with major retailers.  Ask your provider to print your prescriptions down to the pharmacy to speed you on your way home.    OBTAINING A PRIMARY CARE APPOINTMENT    Primary care physicians (PCPs, also known as primary care doctors) are either internists or family medicine doctors. Both types of PCPs focus on health promotion, disease prevention, patient education and counseling, and treatment of acute and chronic medical conditions.    If you need a primary care doctor, please call the below number and ask who  is receiving new patients.     [facility]  Telephone:  [phone]  [email or web address]    DOCTOR REFERRALS  Call [phone] (available 24 hours a day, 7 days a week) if you need any further referrals and we can help you find a primary care doctor or specialist.  Also, available online at:  [email or web address]    YOUR CONTACT INFORMATION  Before leaving please check with registration to make sure we have an up-to-date contact number.  You can call registration at [phone] to update your information.  For questions about your hospital bill, please call  [phone].  For questions about your Emergency Dept Physician bill please call [phone].      FREE HEALTH SERVICES  If you need help with health or social services, please call 2-1-1 for a free referral to resources in your area.  2-1-1 is a free service connecting people with information on health insurance, free clinics, pregnancy, mental health, dental care, food assistance, housing, and substance abuse counseling.  Also, available online at:  http://www.[facility].org    MEDICAL RECORDS AND TESTS  Certain laboratory test results do not come back the same day, for example urine cultures.   We will contact you if other important findings are noted.  Radiology films are often reviewed again to ensure accuracy.  If there is any discrepancy, we will notify you.      Please call [phone] to pick up a complimentary CD of any radiology studies performed.  If you or your doctor would like to request a copy of your medical records, please call [phone].      ORTHOPEDIC INJURY   Please know that significant injuries can exist even when an initial x-ray is read as normal or negative.  This can occur because some fractures (broken bones) are not initially visible on x-rays.  For this reason, close outpatient follow-up with your primary care doctor or bone specialist (orthopedist) is required.    MEDICATIONS AND FOLLOWUP  Please be aware that some prescription medications can cause drowsiness.  Use caution when driving or operating machinery.    The examination and treatment you have received in our Emergency Department is provided on an emergency basis, and is not intended to be a substitute for your primary care physician.  It is important that your doctor checks you again and that you report any new or remaining problems at that time.      24 HOUR PHARMACIES  The nearest 24 hour pharmacy is:    [facility]  [address]  [place], [place] [address]  [phone]      ASSISTANCE WITH  INSURANCE    Affordable Care Act  [facility])  Call to start or finish an application, compare plans, enroll or ask a question.  [phone]  TTY: [phone]  Web:  Healthcare.gov    Help Enrolling in [facility]  [facility] [place]  [phone])[phone] (TOLL-FREE)  [phone] (TTY)  Web:  Http://www.coverva.org    Local Help Enrolling in the [facility]  [place] [facility]  [phone] (MAIN)  Email:  health-help@nvfs .org  Web:  BlackjackMyths.is  Address:  [address], [address] [place], [place] [address]    SEDATING MEDICATIONS  Sedating medications include strong pain medications (e.g. narcotics), muscle relaxers, benzodiazepines (used for anxiety and as muscle relaxers), Benadryl/diphenhydramine and other antihistamines for allergic reactions/itching, and other medications.  If you are unsure if you have received a sedating medication, please ask your physician or nurse.  If you received a sedating medication: DO NOT drive a car. DO NOT operate machinery. DO NOT perform jobs where you need to be alert.  DO NOT drink alcoholic beverages while taking this medicine.     If you get dizzy, sit or lie down at the first signs. Be careful going up and down stairs.  Be extra careful to prevent falls.     Never give this medicine to others.     Keep this medicine out of reach of children.     Do not take or save old medicines. Throw them away when outdated.     Keep all medicines in a cool, dry place. DO NOT keep them in your bathroom medicine cabinet or in a cabinet above the stove.    MEDICATION REFILLS  Please be aware that we cannot refill any prescriptions through the ER. If you need further treatment from what is provided at your ER visit, please follow up with your primary care doctor or your pain management specialist.    FREESTANDING EMERGENCY DEPARTMENTS OF [facility]  Did you know [facility] has two freestanding ERs located just a few miles away?   ER of [place] and [place] ER of  [place] have short wait times, easy free parking directly in front of the building and top patient satisfaction scores - and the same Board Certified Emergency Medicine doctors as [facility].

## 2020-08-16 NOTE — ED Provider Notes (Signed)
Date Time: 08/16/20 11:01 AM   Patient Name: Travis Young   Attending Physician: Dr. Earnest Rosier, M.D.    Attending Note:     I have seen and personally evaluated this patient. I discussed this patient with the APP/resident and agree with the residents findings, assessment and plan as noted by the resident except for differences noted in my documentation.     Selected historical findings: Travis Young is a 25 y.o. male w/ hx of asthma, who presents with 4-5 months of persistent fatigue and anxiety (especially in the mornings), a/w intermittent nausea. Pt has been evaluated in ED x2 and by PCP, with unremarkable blood test 1 week ago. Reports FMHx of MS.     Selected physical examination findings:   Physical Exam  Constitutional:       Appearance: Normal appearance.   HENT:      Head: Normocephalic and atraumatic.   Eyes:      Pupils: Pupils are equal, round, and reactive to light.   Cardiovascular:      Rate and Rhythm: Normal rate and regular rhythm.   Pulmonary:      Effort: Pulmonary effort is normal. No respiratory distress.   Musculoskeletal:         General: Normal range of motion.   Skin:     General: Skin is warm.      Capillary Refill: Capillary refill takes less than 2 seconds.   Neurological:      Mental Status: He is alert and oriented to person, place, and time.       MDM:  Patient is a 25 yo M that presents several months of fatigue.  Patient with intermittent nausea. Labs from prior visits reviewed.  Labs from PCP from care everywhere reviewed. tsh wnl.  No other acute findings. Patient with no acute significant changes.  POC glucose wnl. Vitals wnl.  Patient advised close outpatient follow up with PCP. Strict return precautions provided.     Diagnosis:    ICD-10-CM    1. Fatigue, unspecified type  R53.83        I was acting as a scribe for [name], MD on Young,Travis C  Treatment Team: Scribe: Nigel Berthold    I am the first provider for this patient and I personally  performed the services documented. Treatment Team: Scribe: Nigel Berthold is scribing for me on [name],[name]. This note accurately reflects work and decisions made by me.  Elianis Fischbach, MD       Riely Baskett Dimitrios, MD  08/17/20 [phone]

## 2020-08-16 NOTE — ED Provider Notes (Signed)
History     Chief Complaint   Patient presents with    Fatigue     HPI     ED Course/Medical Decision Making: Fatigue, Anemia, Hypoglycemia    HPI:    Travis Young is a 25 y.o. male who presents with fatigue x3-4 months. Pt states the fatigue is worsening. Pt states the fatigue tends to be the worse in the AM and he feels he cannot get out of bed. Pt states he also had increased anxiety in the AM. Pt also c/o nausea and decreased appetite. Pt states he feels he is sleeping well throughout the night, but when he wakes up in the AM it's as if the sleep didn't happen at all. Pt denies lower leg swelling, vomiting, diarrhea, cough, chest pain, SOB. Pt states he has been seen in the ED and by his PCP and had blood done as recently as last week. Pt states he feels he is not getting answers with his PCP prompting him to come to the ED.     PMHx: None  Allergies: NKDA  Surg: None  FHx: None  PCP: [facility]    Exam: NAD, non-toxic appearing. Heart RRR, Lungs CTA bilateral. Abdomen soft, non-tender. No swelling of lower legs.     Plan: Discussed with pt that ED workup would be repeat blood work primarily, most of which has been done as an outpatient. Discussed with pt that we could also get an EKG and CXR and patient states that both of those have already been done. Patient states he would prefer to forgo blood work at this time and follow up with his PCP. Pt is agreeable to a POC Glu.     Results:     POC Glucose- 72    Reviewed all results with pt. Will discharge pt home. Instructed pt to follow up with PCP. Instructed pt to return to the ED for worsening symptoms. Pt expresses understanding.       [facility]:   - No significant concern on exam to warrant further testing. Agrees with plan of care for home. Agrees to follow up with provider as directed. Will return to the ED if new or concern symptoms develop.   - At discharge, pt looked well, non-toxic, no distress, and is a good candidate for outpatient follow up. No  signs of toxicity to suggest need for further labs, imaging, or for admission  - All questions were answered.     *This note was generated by the Epic EMR system/ Dragon speech recognition and may contain inherent errors or omissions not intended by the user. Grammatical errors, random word insertions, deletions, pronoun errors and incomplete sentences are occasional consequences of this technology due to software limitations. Not all errors are caught or corrected. If there are questions or concerns about the content of this note or information contained within the body of this dictation they should be addressed directly with the author for clarification.*    Past Medical History:   Diagnosis Date    Asthma without status asthmaticus     childhood, "has outgrown"       Past Surgical History:   Procedure Laterality Date    ARTHROSCOPY KNEE, MENISCUS REPAIR Right     ORIF, HAND Right 02/12/2016    Procedure: Open reduction internal fixation right fourth and fifth metacarpal fractures;  Surgeon: Arta Bruce, MD;  Location: [facility];  Service: Orthopedics;  Laterality: Right;       Family History  Problem Relation Age of Onset    Hypertension Mother     Hypertension Father     Asthma Maternal Grandfather        Social  Social History     Tobacco Use    Smoking status: Passive Smoke Exposure - Never Smoker    Smokeless tobacco: Never Used   Substance Use Topics    Alcohol use: No    Drug use: No       .     No Known Allergies    Home Medications             ibuprofen (ADVIL,MOTRIN) 200 MG tablet     Take 4 tablets (800 mg total) by mouth every 8 (eight) hours as needed for Pain.     ondansetron (ZOFRAN ODT) 4 MG disintegrating tablet     Take 1 tablet (4 mg total) by mouth every 8 (eight) hours as needed for Nausea.           Review of Systems   Constitutional: Positive for appetite change and fatigue. Negative for chills, diaphoresis and fever.   HENT: Negative for congestion, drooling, facial  swelling, rhinorrhea, sore throat and trouble swallowing.    Eyes: Negative for photophobia, pain and discharge.   Respiratory: Negative for cough, chest tightness and shortness of breath.    Cardiovascular: Negative for chest pain.   Gastrointestinal: Positive for nausea. Negative for abdominal pain, diarrhea and vomiting.   Musculoskeletal: Negative for back pain, gait problem and neck stiffness.   Skin: Negative for rash.   Neurological: Negative for dizziness, weakness, light-headedness, numbness and headaches.   Psychiatric/Behavioral: Negative for agitation. The patient is nervous/anxious.    All other systems reviewed and are negative.      Physical Exam    BP: 139/88, Heart Rate: 89, Temp: 97.6 F (36.4 C), Resp Rate: 19, SpO2: 95 %, Weight: 81.6 kg    Physical Exam  Vitals and nursing note reviewed.   Constitutional:       Appearance: Normal appearance. He is well-developed and well-groomed. He is not ill-appearing, toxic-appearing or diaphoretic.   HENT:      Head: Normocephalic and atraumatic.      Mouth/Throat:      Mouth: Mucous membranes are moist.      Pharynx: Oropharynx is clear.   Cardiovascular:      Rate and Rhythm: Normal rate and regular rhythm.   Pulmonary:      Effort: Pulmonary effort is normal.      Breath sounds: Normal breath sounds.   Abdominal:      General: Abdomen is flat.      Palpations: Abdomen is soft.      Tenderness: There is no abdominal tenderness.   Musculoskeletal:      Cervical back: Normal range of motion. No rigidity.      Comments: Moving all extremities equally and with ease.    Skin:     General: Skin is warm and dry.   Neurological:      Mental Status: He is alert and oriented to person, place, and time.      Gait: Gait normal.   Psychiatric:         Behavior: Behavior is cooperative.           MDM and ED Course     ED Medication Orders (From admission, onward)    None             MDM  Procedures    Clinical Impression & Disposition     Clinical  Impression  Final diagnoses:   None        ED Disposition     None           New Prescriptions    No medications on file                 Eula Flax, [place]  08/20/20 1849

## 2020-08-16 NOTE — EDIE (Signed)
COLLECTIVE?NOTIFICATION?08/16/2020 09:44?Travis Young, Travis Young?MRN: [id number]    Criteria Met      3 Different Facilities in 90 Days    5 ED Visits in 12 Months    Security and Safety  No recent Security Events currently on file    ED Care Guidelines  There are currently no ED Care Guidelines for this patient. Please check your facility's medical records system.        Prescription Monitoring Program  000??- Narcotic Use Score  000??- Sedative Use Score  000??- Stimulant Use Score  000??- Overdose Risk Score  - All Scores range from 000-999 with 75% of the population scoring < 200 and on 1% scoring above 650  - The last digit of the narcotic, sedative, and stimulant score indicates the number of active prescriptions of that type  - Higher Use scores correlate with increased prescribers, pharmacies, mg equiv, and overlapping prescriptions  - Higher Overdose Risk Scores correlate with increased risk of unintentional overdose death   Concerning or unexpectedly high scores should prompt a review of the PMP record; this does not constitute checking PMP for prescribing purposes.      E.D. Visit Count (12 mo.)  Facility Visits   [facility] 1   [facility] 4   [facility] 1   Total 6   Note: Visits indicate total known visits.     Recent Emergency Department Visit Summary  Date Facility [facility] Type Diagnoses or Chief Complaint   Aug 16, 2020 [facility] [place] [facility]      triage      Jul 31, 2020 [facility] [facility] [place] [facility]      Abdominal Pain      Epigastric pain      Gastro-esophageal reflux disease without esophagitis      Jul 29, 2020 [facility] [place] [place] [facility]      difficulty breathing, pain on right side      1. Procedure and treatment not carried out due to patient leaving prior to being seen by health care provider      May 19, 2020 [facility] [facility] [place] [facility] [place] [facility]       Headache; Dizziness      Headache      Headache, unspecified      May 16, 2020 [facility] [place] [facility] [place] [facility]      exposed to fumes      Feb 02, 2020 [facility] [facility] [place] [facility]gency      abdominal pain, N/V      Allergic Reaction      Allergy, unspecified, initial encounter          Recent Inpatient Visit Summary  No recorded inpatient visits.     Care Team  Provider Specialty Phone Fax Service Dates   Daneil Dan, MD Family Medicine [phone]  Current      Collective Portal  This patient has registered at the [facility] Emergency Department   For more information visit: [email or web address]     PLEASE NOTE:     1.   Any care recommendations and other clinical information are provided as guidelines or for historical purposes only, and providers should exercise their own clinical judgment when providing care.    2.   You may only use this information for purposes of treatment, payment or health care  operations activities, and subject to the limitations of applicable [employer] Policies.    3.   You should consult directly with the organization that provided a care guideline or other clinical history with any questions about additional information or accuracy or completeness of information provided.    ? 2022 [employer] - www.collectivemedical.com
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
## 2023-08-14 NOTE — ED Provider Notes (Signed)
 [place]  [facility] Emergency Department  History and Physical Exam     Patient Name: Travis Young   Date of Birth: [birth year]  Medical Record Number: [id number]     History of Present Illness     Chief Complaint:   Chief Complaint   Patient presents with    Abdominal Pain     Pt reports upper abdominal pain, chest pain, cough for 3 days.         History Provided By: patient     History: Travis Young is a 28 y.o. male who presents with complaints of chest pressure, shortness of breath, productive cough, fatigue, mild intermittent abdominal cramping, and diarrhea. The patient states that he believed he had the flu about 3 weeks ago as this is when all of his symptoms started.  He states that he seemed to be feeling better although his symptoms never completely resolved.  However, starting on Monday, he reports waking up from his sleep with very intense chest pressure, which was new.  The patient states that the chest pressure comes and goes without identifiable pattern, although he endorses exacerbation of the chest pain with deep breathing.  He denies fevers, nausea or vomiting, diarrhea, changes in appetite, sore throat, neck stiffness/rigidity, dysphagia, headache, double or blurry vision, photophobia, or any other complaints at this time.    Per the patient, he is otherwise healthy and without any medical problems.  He is UTD on vaccines.     Primary care provider: Maybelle Axe      Past History  Past Medical History:   Past Medical History:   Diagnosis Date    Childhood asthma         Past Surgical History:    No past surgical history on file.    Family History:   No family history on file.     Social History:   Social History     Tobacco Use    Smoking status: Never    Smokeless tobacco: Never   Substance Use Topics    Alcohol use: Not Currently       Allergies:   Allergies   Allergen Reactions    Mushroom         Home Medications:  Current Medications            ibuprofen  (ADVIL ,MOTRIN ) 600 mg  tablet    ondansetron  ODT (ZOFRAN -ODT) 4 mg dispersible tablet    ondansetron  ODT (ZOFRAN -ODT) 4 mg dispersible tablet                 Review of Systems   Review of Systems  See HPI.     Physical Exam     ED Triage Vitals [08/14/23 0721]   Temp Heart Rate Resp BP SpO2   36.9 C (98.4 F) (!) 102 17 153/89 99 %      Temp src Heart Rate Source Patient Position BP Location FiO2 (%) Set   -- Monitor Sitting Left arm --        Physical Exam  Vitals and nursing note reviewed.   Constitutional:       General: He is not in acute distress.     Appearance: Normal appearance. He is well-developed. He is not ill-appearing, toxic-appearing or diaphoretic.   HENT:      Head: Normocephalic and atraumatic.      Jaw: There is normal jaw occlusion.      Right Ear: External ear normal.      Left Ear:  External ear normal.      Nose: Rhinorrhea present. Rhinorrhea is clear.      Right Sinus: No maxillary sinus tenderness or frontal sinus tenderness.      Left Sinus: No maxillary sinus tenderness or frontal sinus tenderness.      Mouth/Throat:      Lips: Pink. No lesions.      Mouth: Mucous membranes are moist.      Tongue: No lesions. Tongue does not deviate from midline.      Palate: No mass and lesions.      Pharynx: Oropharynx is clear. Uvula midline. No pharyngeal swelling, oropharyngeal exudate, posterior oropharyngeal erythema, uvula swelling or postnasal drip.      Tonsils: No tonsillar exudate or tonsillar abscesses.   Eyes:      General: Gaze aligned appropriately.      Extraocular Movements: Extraocular movements intact.      Right eye: Normal extraocular motion and no nystagmus.      Left eye: Normal extraocular motion and no nystagmus.      Conjunctiva/sclera: Conjunctivae normal.      Pupils: Pupils are equal, round, and reactive to light.   Neck:      Trachea: Trachea and phonation normal. No tracheal tenderness, abnormal tracheal secretions or tracheal deviation.      Meningeal: Brudzinski's sign and Kernig's sign absent.       Comments: No meningeal signs.  No abnormal secretions.  Cardiovascular:      Rate and Rhythm: Normal rate and regular rhythm.      Pulses: Normal pulses.      Heart sounds: Normal heart sounds. No murmur heard.     No friction rub. No gallop.   Pulmonary:      Effort: Pulmonary effort is normal. No accessory muscle usage, prolonged expiration or respiratory distress.      Breath sounds: Normal breath sounds and air entry. No stridor. No wheezing, rhonchi or rales.      Comments: Lungs CTAB.  No adventitious breath sounds.  Chest:      Chest wall: No deformity, swelling or tenderness.      Comments: No chest wall tenderness or adenopathy.  Abdominal:      General: Abdomen is flat. Bowel sounds are normal. There is no distension or abdominal bruit.      Palpations: Abdomen is soft. There is no pulsatile mass.      Tenderness: There is abdominal tenderness. There is no right CVA tenderness, left CVA tenderness, guarding or rebound.          Comments: Very mild tenderness to epigastrium.  No rebound or peritoneal signs.   Musculoskeletal:         General: No swelling. Normal range of motion.      Cervical back: Full passive range of motion without pain, normal range of motion and neck supple. No rigidity or tenderness. No spinous process tenderness. Normal range of motion.      Right lower leg: No edema.      Left lower leg: No edema.      Comments: Patient moves all extremities.   Lymphadenopathy:      Cervical: No cervical adenopathy.   Skin:     General: Skin is warm and dry.      Capillary Refill: Capillary refill takes less than 2 seconds.      Coloration: Skin is not jaundiced or pale.      Findings: No bruising, erythema, lesion or rash.   Neurological:  General: No focal deficit present.      Mental Status: He is alert and oriented to person, place, and time.      Sensory: Sensation is intact.      Motor: Motor function is intact. No weakness.      Coordination: Coordination is intact.      Gait: Gait  normal.      Deep Tendon Reflexes: Reflexes are normal and symmetric.   Psychiatric:         Mood and Affect: Mood normal.              Diagnostic Study Results   Labs -   Labs Reviewed   COVID-19/INFLUENZA A/B, LIAT - Abnormal       Result Value    SARS-CoV-2, PCR Negative      Flu A PCR Negative      Flu B PCR Positive (*)     SARS-CoV-2, PCR        Narrative:     ------------------------ADDITIONAL INFORMATION-----------------------------   Testing was performed using the cobas SARS-CoV-2 & Influenza A/B Nucleic Acid Test for use on the [employer] Liat.    Test performed by:      [facility]      [address] [place] [address]      Lab Director: Rodgers MALVA Hagedorn, MD   CBC WITH AUTO DIFFERENTIAL - Abnormal    Auto WBC 9.1      RBC 5.78 (*)     Hemoglobin 15.9      Hematocrit 48.4      MCV 83.7      MCH 27.5 (*)     MCHC 32.9      RDW 12.7      Platelets 240      MPV 9.6      nRBC 0      Neutrophils Relative 45.8      Lymphocytes Relative 44.9      Monocytes Relative 8.6      Eosinophils Relative 0.1      Basophils Relative 0.4      IG% 0.20      Neutrophils Absolute 4.17      Lymphocytes Absolute 4.09      Monocytes Absolute 0.78      Eosinophils Absolute 0.01      Basophils Absolute 0.04      Immature Granulocytes Absolute 0.02     COMPREHENSIVE METABOLIC PANEL - Normal    Sodium 139      Potassium 4.3      Chloride 101      CO2 27      Anion Gap 11      BUN 10      Creatinine 1.19      Glucose 88      Calcium 9.4      AST 29      ALT (SGPT) 26      Alkaline Phosphatase 88      Total Protein 7.4      Albumin 3.9      eGFR 86      Total Bilirubin 0.46     TROPONIN T - Normal    Troponin T <6     LIPASE - Normal    Lipase 24     CBC WITH DIFFERENTIAL    Narrative:     The following orders were created for panel order CBC and differential.  Procedure  Abnormality         Status                     ---------                               -----------          ------                     CBC Auto Differential[[id number]]        Abnormal            Final result                 Please view results for these tests on the individual orders.   D-DIMER, QUANTITATIVE    D-Dimer  630      Narrative:     For exclusion of DVT, result should be <500 ng/ml FEU    Reference Interval <500 ng/mL FEU    Quantitative DDimer >500 ng/mL FEU: nonspecific,  can be seen in multiple conditions, cannot exclude thromboembolic event.    Age-adjusted D-dimer thresholds (age x 10 ng/mL rather than 500 ng/mL) can be used in patients over 50 to determine whether imaging is warranted for the evaluation of pulmonary embolism.          Radiologic Studies -  CT Chest - PE Protocol   Final Result   IMPRESSION:      1. No CT evidence of pulmonary embolism.   2. No CT evidence of acute cardiopulmonary process.         PQRS: H0442   PQRS PE: No evidence of pulmonary embolism. (ACRad 37-)   There are no calcifications noted along the coronary arteries, (ACRad 36-).   PQRS: H0448         Electronically signed by Derinda Baumgartner, MD on 08/14/2023 10:32 AM      Xray Chest; 2 Views Pa + Lat   Final Result   IMPRESSION:      1.  No active cardiopulmonary disease.            Electronically signed by Derinda Baumgartner, MD on 08/14/2023 8:52 AM           Medical Decision Making   Patient with history as above presented with complaints of chest pressure, shortness of breath, productive cough, fatigue, mild intermittent abdominal cramping, and diarrhea starting on Monday. History obtained from the patient.     Reviewed external records.      Patient was nontoxic, in no acute distress, not ill-appearing, and stable. Ambulatory. Exam as above. Labs reviewed. CBC without white count, anemia, or platelet dysfunction.  CMP without AKI, liver enzyme abnormality, or electrolyte derangement. HST <6. Lipase 24. D-dimer elevated at 630, so CT PE study ordered. COVID and Influenza A negative. Influenza B positive.    Independently  reviewed imaging with radiologist as definitive read.   EKG reviewed by myself and attending physician. No STEMI or RBBB.  NSR with sinus arrhythmia 89.    Differential diagnosis considered. Overall presentation is consistent with influenza B. Low suspicion for pneumonia, pleural effusion, pneumothorax, pulmonary embolism, aortic dissection, AAA, pancreatitis, ACS, sepsis, PTA or RTA, COVID, or meningitis.    The patient refused medications while in the ER.    Social determinants of health considered: No pertinent adverse social determinant of health were noted. The patient was asked to follow-up  with PCP in 2-3 days for re-evaluation.  The use of Tamiflu was discussed with the patient and he wished to refrain from this medication.  I encouraged that he alternate acetaminophen  and ibuprofen  as needed for any fevers or pain control.  I reminded him to stay hydrated and to rest for the next few days.  I anticipate that his symptoms will improve within 5 to 7 days.  Antibiotics not indicated at this time, although the patient was provided with very strict ER return precautions    Consideration was given for admission, but the patient was stable for outpatient management. The patient was given strict return precautions and asked to return to the ER if symptoms worsen, or if symptoms progress to involve fevers, chest pain, shortness of breath or difficulty breathing, abdominal pain, vomiting, severe diarrhea, severe headache, vision changes, light-headedness/dizziness, syncope, or weakness.    Disposition: Discussed need to follow up diagnostics, including incidental findings. Discharged with instructions to obtain outpatient follow up of patients symptoms and findings, with strict return precautions if patient develops new or worsening symptoms    I, Marry CHARLENA Kling, PA am the ED Provider of record for this patient. I reviewed the vital signs, available nursing notes, past medical history, past surgical history,  family history and social history.      ED Course:   ED Course as of 08/15/23 1818   Thu Aug 14, 2023   0825 I have reviewed the initial EKG and there is no evidence of acute ST elevation MI or of a new LBBB. Therefore, this patient is not a STEMI. NSR with sinus arrhythmia 89. [GM]   [id number] The patient's PERC score is not 0, and is therefore positive, as the patient has one or more of the following: initial HR>=100.  [GM]   0918 Flu B PCR(!): Positive [GM]   0934 Patient is requesting to leave the ER after CXR was read.  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  I reviewed results with the patient thus far which are notable for influenza B.  CXR does not suggest pneumonia or lung infiltrates.  However, the patient presented with complaints of chest pain, shortness of breath, and pain with deep breathing.  Patient was tachycardic on initial evaluation.  Because he is not PERC negative, D-dimer was ordered to evaluate for PE.  I informed the patient about the possible need for CT of chest if this value is elevated.  Because this value is still pending, I informed him that he may leave if needed, but he will need to sign an AMA form as leaving prior to completion of his workup would be going against my medical advice.  The patient states that he will stay for now and understands that if he chooses to leave prior to labs resulting, or prior to CT scan (if needed), he will need to sign AMA form. [GM]   [id number] D-Dimer : 630 [GM]   1045 Patient unable to provide a stool sample while in the ER. [GM]   1110 Heart Rate: 79  Improved from initial tachycardia. [GM]      ED Course User Index  [GM] Marry FORBES Kling, PA        Procedures     Disposition and Diagnosis     Diagnoses for today's visit:   1. Influenza B    2. Chest wall pain    3. Abdominal pain, epigastric  4. Cough, unspecified type      Discharge Medication List as of 08/14/2023 10:59 AM         _______________________________     Some transcription errors may be present due to use of Dragon (voice recognition) software.       Marry FORBES Kling, [place]  08/15/23 1818

## 2023-08-16 NOTE — ED Provider Notes (Signed)
 I was available for consultation by the advanced practice provider. If consulted, either the advanced practice provider will document their conversation with me or a brief description of a face-to-face encounter with the patient will appear in the work up section of this note.     OLE CHRISTELLA BUTTS, MD         Ole CHRISTELLA Butts, MD  08/16/23 [phone]

## 2024-04-28 NOTE — Progress Notes (Signed)
 EPIC MRN: [id number]   CARE TEAM:  Patient Care Team:  Diedra Haley, MD as PCP - General (Family Medicine)    ASSESSMENT:  (M23.91,  M23.92) Internal derangement of both knees  (primary encounter diagnosis)  (M25.561) Right knee pain, unspecified chronicity    Patient Active Problem List   Diagnosis    Closed fracture of shaft of fifth metacarpal bone of right hand    High-density lipoprotein deficiency    History of asthma    Major depressive disorder, single episode, moderate     Impression: New diagnosis of bilateral knee internal derangement    PLAN:  I discussed my findings with the patient and answered any questions. I discussed the diagnosis with the patient as well as multiple treatment options.    At this time, the patient elected to proceed with bilateral knee MRI's to rule out medial meniscus tears given history of injury to the right knee and history of left knee medial meniscectomy vs repair in 2016. Referrals provided today.  Patient was offered Rx for Meloxicam , however he declined and will take OTC NSAIDs.  Patient may progress with his activities as tolerated and use pain as a guide.  Follow-up with myself or Dr. Sebastian for MRI review.    Treatment Plan:   Orders Placed This Encounter    X-ray knee left 4+ views    X-ray knee right 4+ views    MRI knee right without contrast (26278)    MRI knee left without contrast (26278)    BMI Patient Education    BP Patient Education     Follow-up: Return for MRI review.    HISTORY OF PRESENT ILLNESS:  Travis Young; EPIC MRN: [id number]  Age: 28 y.o. Sex: male    Chief Complaint: Pain of the Right Knee and Pain of the Left Knee  History of present illness: Travis Young is a 28 y.o. male who presents today for an evaluation of bilateral knee pain that has been ongoing for approximately 1 week. The patient reports he was playing football approximately 1 week ago when he went to [place] and felt a sudden pain in his right knee. Since this incident, he has  continued experiencing a sharp, stabbing pain to the medial aspect of the right knee with walking and exercise. He also reports intermittent left knee pain that is localized to the medial aspect of his knee. Left knee pain increases with walking and activity.     PSHx is significant for left knee arthroscopy with medial meniscectomy vs repair performed on 07/08/2014 with Dr. Sebastian.    Pain score:  5  out of 10    Hand-dominance: right     OBJECTIVE:  Constitutional:  No acute distress. His body mass index is 29.99 kg/m.  Eyes:  Sclera are nonicteric.  Respiratory:  No labored breathing.  Cardiovascular:  No marked edema.  Skin:  No marked skin ulcers.  Neurological:  No marked sensory loss noted.  Psychiatric: Alert and oriented x3.  Musculoskeletal     Right knee:  No effusion.  ROM 0/140 degrees of extension and flexion.  Medial patellar facet is mildly TTP.  Medial joint-line is maximally TTP.  Normal sensation.  Normal quadriceps strength.  Mildly positive McMurray's.  Negative Lachman's with solid endpoint.  Normal stability with varus/valgus stress testing at 30 degrees.  There is no lymphadenopathy appreciated.    Left knee:  No effusion.  Well-healed portal incisions without signs of infection.  ROM 0/130  degrees of extension and flexion.  No patellar tenderness.  Medial joint-line is mildly TTP.  Normal sensation.  Normal quadriceps strength.  Negative McMurray's.  Negative Lachman's with solid endpoint.  Normal stability with varus/valgus stress testing at 30 degrees.  There is no lymphadenopathy appreciated.    IMAGING / STUDIES:   Order: XR KNEE 4+ VW LEFT - Indication: Right knee pain, unspecified   chronicity  Order: XR KNEE 4+ VW RIGHT - Indication: Right knee pain, unspecified   chronicity      X-ray knee right 4+ views  Result Date: 04/28/2024  Shielding: Shielded. Weight Bearing. Views: Standing AP, Lat, [place], [place].     Impression: No acute fractures or dislocations. No osteophyte  formation or joint space narrowing. No masses or bony lesions.     X-ray knee left 4+ views  Result Date: 04/28/2024  Shielding: Shielded. Weight Bearing. Views: Standing AP, Lat, [place], [place].     Impression: No acute fractures or dislocations. No osteophyte formation or joint space narrowing. No masses or bony lesions.       PROCEDURES:  No procedures performed on this encounter    I, Sid Begun, PA, personally, performed the services described in this documentation, and it is both accurate and complete.   Supervising MD: Maude Ned, MD

## 2024-07-01 ENCOUNTER — Encounter (INDEPENDENT_AMBULATORY_CARE_PROVIDER_SITE_OTHER): Payer: Self-pay

## 2024-07-01 ENCOUNTER — Ambulatory Visit (INDEPENDENT_AMBULATORY_CARE_PROVIDER_SITE_OTHER)

## 2024-07-01 VITALS — BP 138/81 | HR 71 | Ht 70.87 in | Wt 227.4 lb

## 2024-07-01 DIAGNOSIS — S46312A Strain of muscle, fascia and tendon of triceps, left arm, initial encounter: Secondary | ICD-10-CM

## 2024-07-01 DIAGNOSIS — M778 Other enthesopathies, not elsewhere classified: Secondary | ICD-10-CM

## 2024-07-01 MED ORDER — MELOXICAM 15 MG PO TABS: 15.0000 mg | ORAL_TABLET | Freq: Every day | ORAL | 1 refills | Status: AC

## 2024-07-01 MED ORDER — PREDNISONE 50 MG PO TABS: 50.0000 mg | ORAL_TABLET | Freq: Every day | ORAL | 0 refills | Status: AC

## 2024-07-01 NOTE — Patient Instructions (Addendum)
 PLAN:    - Take Meloxicam  (Mobic ) once daily for pain as needed. Do not take NSAIDs (ibuprofen /motrin , naproxen/Aleve, diclofenac/voltaren) when you are taking Mobic .  It is OKAY to take Acetaminophen  (Tylenol ) at the same time since these medications work differently.    - start gentle range of motion and stretching    - take prednisone  50mg  once daily for 5 days    - consider PT if not improving    - relative rest for 5-7 days    - follow in 2 weeks if needed; otherwise follow up as needed

## 2024-07-01 NOTE — Progress Notes (Addendum)
 Date of Exam: 07/01/2024 12:12 PM      Patient ID: Travis Young is a 29 y.o. male.  Attending Physician: Sherlean LOISE Lack, DO      Chief Complaint:   Chief Complaint   Patient presents with    Elbow Pain     Left elbow pain             HPI:     Travis Young is a 28y M presenting to clinic for acute, severe left arm pain.    Patient states that he had a max dead lift and other weight lifting 2 days ago (06/29/2024) and the next morning he woke up with acute, severe left posterior elbow pain.  Noted that there was significant swelling, but no ecchymosis, redness or significant warmth.  He was having pain with elbow extension as well as flexion due to the swelling and pain.  There was no acute injury or single repetition that caused his symptoms.    Last night, he presented to the emergency department as the pain was not resolving with conservative management.  He had x-rays and CT elbow which showed concern for possible small avulsion fracture versus calcific tendinitis of the distal triceps.  He was splinted in the ED. he was given Percocets in the ED and states that he does not like how they make him feel and would like to discontinue these.  States that this morning he noticed the swelling was much improved, but had some pain from the splint.  Denies numbness or tingling into fingers/hand.  Previously was unable to supinate and pronate, but now can do it without significant pain.    No prior injury or surgery of the left upper extremity.       ROS:   As per HPI.  Otherwise as below.  Review of Systems        Problem List:   Problem List[1]     Current Meds:   Medications Taking[2]      Allergies:   Allergies[3]     Past Surgical History:   Past Surgical History[4]       Family History:   Family History[5]       Social History:   Social History[6]       The following sections were reviewed this encounter by the provider:   Tobacco  Allergies  Meds  Problems  Med Hx  Surg Hx  Fam Hx              Vital  Signs:   BP 138/81 (BP Site: Right arm)   Pulse 71   Ht 1.8 m (5' 10.87)   Wt 103.1 kg (227 lb 6.4 oz)   BMI 31.84 kg/m           Physical Exam:   Physical Exam     Musculoskeletal:  Left upper extremity exam:  Mild swelling noted at posterior elbow.  Range of motion is from 5 to 100 actively and 0-115 passively.  Has pain with both active and passive range of motion's at the extremes of motion.  5-/5 strength with elbow flexion and 4+/5 with elbow extension.  Exquisite tenderness to palpation at the distal triceps near the insertion.  No olecranon tenderness to palpation.  No tenderness of the lateral or medial epicondyle.  No tenderness of the biceps.  No significant shoulder tenderness to palpation throughout.  Passive and resisted supination and pronation are full and without significant pain.  Distal neurologic exam is completely  benign.          Procedure(s):   Procedures        Radiology:     Left elbow x-rays performed in clinic today (07/01/2024) were independently interpreted by me:  No obvious fracture or dislocation.  Soft tissue swelling noted.  Alignment is anatomic.    Left elbow x-ray from the emergency department (06/30/2024) was independently reviewed by me:  Findings:  Suboptimal lateral radiograph with questionable slightly elevated anterior fat pad.  Underlying occult fracture is not excluded.  No dislocation.  No radiopaque foreign body or soft tissue gas.    Left elbow CT from the emergency department (06/30/2024) was independently reviewed by me:  Impression:  There is a 7 mm ossification adjacent to the triceps insertion on the olecranon process.  There is questionable surrounding intramuscular hypoattenuation which may represent underlying edema versus beam hardening artifact.  The findings may represent a small avulsion fracture or calcific tendinitis.         Medical Decision Making    Assessment/Plan:   1. Triceps tendinitis  - Referral to Physical Therapy - EXTERNAL; Future    2.  Strain of left triceps tendon, initial encounter  - XR Elbow Left AP Lateral And Obliques  - Referral to Physical Therapy - EXTERNAL; Future      Assessment & Plan    Travis Young is a pleasant 29 year old male here for left elbow pain.  Pain started the morning after a max lift day which included deadlifts among other lifting activities.  There was swelling in the posterior elbow along the distal triceps and significant pain.  He presented to the ED last night and after x-ray and CT there was concern for avulsion fracture versus calcific tendinitis and he was put in a posterior arm splint.  Patient states the swelling has much improved and he is in less pain, but feels stiff from the splint.  He was given Percocets in the ED and he has not been tolerating this and would like an alternative option.  We reviewed imaging from last night and this morning in clinic and after exam discussed most likely cause of his symptoms and what we can do about it.  We discussed how I believe his history and exam favor calcific tendinitis of the distal triceps versus less likely avulsion fracture.  Detailed plan is as follows:    -Independently reviewed left elbow x-ray from the ED yesterday  -Independently reviewed left elbow CT from the ED yesterday  -Ordered and independently interpreted left elbow x-ray in clinic today as above  - Removed elbow splint and recommended against splinting at this time as I do not believe he has an avulsion fracture and exam and history most consistent with calcific tendinitis/tricep strain.  Recommended gentle range of motion to prevent stiffness and relative rest x 5-7 days.  He can slowly get back into his normal routine as tolerated after initial information resolves.  -Prescribed prednisone  50 mg once daily as needed for 5 days for significant inflammation and pain  -Meloxicam  15 mg once daily as needed for pain.  Understands not to take NSAIDs concurrently, but okay to take Tylenol   concurrently.  - PT referral placed with instructions to go 1-2 times weekly.  Recommended seeing if pain subsides over the next week or 2 and if not improving completely, recommend formal PT  - Home exercise program provided with resistance band.   - We discussed corticosteroid injection under [facility] if their pain is  not improving, worsening, or makes PT/HEP difficult.  We discussed the risk of putting steroids near a tendon, but would consider given calcific tendinitis as working diagnosis  - We discussed advanced imaging (MRI), but will defer for now and will reconsider if patient is not adequately progressing or worsening.     Follow-up in 2 weeks if not improving or worsening.  Will discuss MRI versus therapeutic management at that time.             Follow-up:   Return in about 2 weeks (around 07/15/2024).        67 minutes were spent on this encounter, either face-to-face with the patient and/or on the highlighted activities listed below:    Preparing for the visit (such as reviewing tests, notes, x-rays)  Getting and/or reviewing a history that was separately obtained  Performing the exam  Counseling and providing education to the patient, family, or caregiver  Documenting information in the medical record  Interpreting results and sharing that information with the patient, family or caregiver  Ordering medicines, tests, or procedures  Care coordination           Christian N Kaschak, DO      Please pardon any potential grammatical errors or typos as aspects of this note may have been created through speech-to-text software.            [1]   Patient Active Problem List  Diagnosis    Shoulder injury, left, initial encounter    Fracture, metacarpal shaft   [2]   Outpatient Medications Marked as Taking for the 07/01/24 encounter (Office Visit) with Virlinda Sherlean SAILOR, DO   Medication Sig Dispense Refill    oxyCODONE -acetaminophen  (PERCOCET) 5-325 MG per tablet Take 1 tablet by mouth every 8 (eight) hours as needed  for Pain     [3] No Known Allergies  [4]   Past Surgical History:  Procedure Laterality Date    ARTHROSCOPY KNEE, MENISCUS REPAIR Right     ORIF, HAND Right 02/12/2016    Procedure: Open reduction internal fixation right fourth and fifth metacarpal fractures;  Surgeon: Debby Maude Ingle, MD;  Location: [facility];  Service: Orthopedics;  Laterality: Right;   [5]   Family History  Problem Relation Name Age of Onset    Hypertension Mother      Hypertension Father      Asthma Maternal Grandfather     [6]   Social History  Tobacco Use    Smoking status: Smoker, Current Status Unknown    Smokeless tobacco: Never    Tobacco comments:     hookah   Vaping Use    Vaping status: Never Used   Substance Use Topics    Alcohol use: Yes     Comment: 1-2 x month - wine     Drug use: No
